# Patient Record
Sex: Male | Born: 1965 | Race: Asian | Hispanic: No | Marital: Married | Smoking: Former smoker
Health system: Southern US, Community
[De-identification: ages and names within clinical notes are randomized; demographics above are authoritative.]

## PROBLEM LIST (undated history)

## (undated) DIAGNOSIS — J189 Pneumonia, unspecified organism: Secondary | ICD-10-CM

---

## 2009-02-13 ENCOUNTER — Emergency Department (HOSPITAL_COMMUNITY): Admission: EM | Admit: 2009-02-13 | Discharge: 2009-02-13 | Payer: Self-pay | Admitting: Family Medicine

## 2010-11-23 ENCOUNTER — Emergency Department (HOSPITAL_COMMUNITY): Payer: Self-pay

## 2010-11-23 ENCOUNTER — Emergency Department (HOSPITAL_COMMUNITY)
Admission: EM | Admit: 2010-11-23 | Discharge: 2010-11-23 | Disposition: A | Discharge status: Home / Self Care | Payer: Self-pay | Attending: Emergency Medicine | Admitting: Emergency Medicine

## 2010-11-23 DIAGNOSIS — R0789 Other chest pain: Secondary | ICD-10-CM | POA: Insufficient documentation

## 2010-11-23 DIAGNOSIS — S2239XA Fracture of one rib, unspecified side, initial encounter for closed fracture: Secondary | ICD-10-CM | POA: Insufficient documentation

## 2011-06-05 ENCOUNTER — Observation Stay (HOSPITAL_COMMUNITY)
Admission: EM | Admit: 2011-06-05 | Discharge: 2011-06-06 | Disposition: A | Discharge status: Home / Self Care | Payer: Self-pay | Attending: Emergency Medicine | Admitting: Emergency Medicine

## 2011-06-05 ENCOUNTER — Encounter: Payer: Self-pay | Admitting: Emergency Medicine

## 2011-06-05 DIAGNOSIS — J189 Pneumonia, unspecified organism: Principal | ICD-10-CM | POA: Insufficient documentation

## 2011-06-05 DIAGNOSIS — E860 Dehydration: Secondary | ICD-10-CM

## 2011-06-05 DIAGNOSIS — R112 Nausea with vomiting, unspecified: Secondary | ICD-10-CM | POA: Insufficient documentation

## 2011-06-05 MED ORDER — ONDANSETRON HCL 4 MG/2ML IJ SOLN
INTRAMUSCULAR | Status: AC
  Administered 2011-06-05: 4 mg
  Filled 2011-06-05: qty 2

## 2011-06-05 NOTE — ED Notes (Signed)
C/o nausea, vomiting, and diarrhea x 30 min.
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
## 2011-06-05 NOTE — ED Notes (Signed)
PT reports his N/V started tonight. Pt has vomited multiple times

## 2011-06-06 ENCOUNTER — Emergency Department (HOSPITAL_COMMUNITY): Payer: Self-pay

## 2011-06-06 LAB — DIFFERENTIAL
Basophils Absolute: 0 10*3/uL (ref 0.0–0.1)
Basophils Relative: 0 % (ref 0–1)
Eosinophils Absolute: 0.3 10*3/uL (ref 0.0–0.7)
Eosinophils Relative: 1 % (ref 0–5)
Lymphocytes Relative: 2 % — ABNORMAL LOW (ref 12–46)
Lymphs Abs: 0.4 10*3/uL — ABNORMAL LOW (ref 0.7–4.0)
Monocytes Absolute: 1.5 10*3/uL — ABNORMAL HIGH (ref 0.1–1.0)
Monocytes Relative: 8 % (ref 3–12)
Neutro Abs: 17.3 10*3/uL — ABNORMAL HIGH (ref 1.7–7.7)
Neutrophils Relative %: 89 % — ABNORMAL HIGH (ref 43–77)

## 2011-06-06 LAB — COMPREHENSIVE METABOLIC PANEL
ALT: 27 U/L (ref 0–53)
AST: 28 U/L (ref 0–37)
Albumin: 4.2 g/dL (ref 3.5–5.2)
Alkaline Phosphatase: 62 U/L (ref 39–117)
BUN: 23 mg/dL (ref 6–23)
Chloride: 102 mEq/L (ref 96–112)
Potassium: 3.4 mEq/L — ABNORMAL LOW (ref 3.5–5.1)
Total Bilirubin: 1.4 mg/dL — ABNORMAL HIGH (ref 0.3–1.2)

## 2011-06-06 LAB — CBC
HCT: 48.3 % (ref 39.0–52.0)
Hemoglobin: 16.5 g/dL (ref 13.0–17.0)
MCH: 27.1 pg (ref 26.0–34.0)
MCHC: 34.2 g/dL (ref 30.0–36.0)
MCV: 79.4 fL (ref 78.0–100.0)
Platelets: 194 10*3/uL (ref 150–400)
RBC: 6.08 MIL/uL — ABNORMAL HIGH (ref 4.22–5.81)
RDW: 13.9 % (ref 11.5–15.5)
WBC: 19.5 10*3/uL — ABNORMAL HIGH (ref 4.0–10.5)

## 2011-06-06 LAB — URINALYSIS, ROUTINE W REFLEX MICROSCOPIC
Bilirubin Urine: NEGATIVE
Glucose, UA: NEGATIVE mg/dL
Hgb urine dipstick: NEGATIVE
Ketones, ur: 15 mg/dL — AB
Protein, ur: NEGATIVE mg/dL

## 2011-06-06 LAB — LIPASE, BLOOD: Lipase: 26 U/L (ref 11–59)

## 2011-06-06 MED ORDER — CEFTRIAXONE SODIUM 1 G IJ SOLR: 1.0000 g | Freq: Once | INTRAMUSCULAR | Status: DC

## 2011-06-06 MED ORDER — DEXTROSE 5 % IV SOLN
1.0000 g | Freq: Once | INTRAVENOUS | Status: AC
  Administered 2011-06-06: 1 g via INTRAVENOUS

## 2011-06-06 MED ORDER — PROMETHAZINE HCL 25 MG PO TABS: 25.0000 mg | ORAL_TABLET | Freq: Four times a day (QID) | ORAL | Status: AC | PRN

## 2011-06-06 MED ORDER — MORPHINE SULFATE 4 MG/ML IJ SOLN
4.0000 mg | Freq: Once | INTRAMUSCULAR | Status: AC
  Administered 2011-06-06: 4 mg via INTRAVENOUS
  Filled 2011-06-06: qty 1

## 2011-06-06 MED ORDER — SODIUM CHLORIDE 0.9 % IV SOLN
INTRAVENOUS | Status: DC
  Administered 2011-06-06 (×2): via INTRAVENOUS

## 2011-06-06 MED ORDER — SODIUM CHLORIDE 0.9 % IV BOLUS (SEPSIS)
500.0000 mL | Freq: Once | INTRAVENOUS | Status: AC
  Administered 2011-06-06: 02:00:00 via INTRAVENOUS

## 2011-06-06 MED ORDER — DEXTROSE 5 % IV SOLN
INTRAVENOUS | Status: AC
  Filled 2011-06-06: qty 10

## 2011-06-06 MED ORDER — IOHEXOL 300 MG/ML  SOLN
100.0000 mL | Freq: Once | INTRAMUSCULAR | Status: AC | PRN
  Administered 2011-06-06: 100 mL via INTRAVENOUS

## 2011-06-06 MED ORDER — DOXYCYCLINE HYCLATE 100 MG PO CAPS: 100.0000 mg | ORAL_CAPSULE | Freq: Two times a day (BID) | ORAL | Status: AC

## 2011-06-06 NOTE — ED Notes (Signed)
Patient transported to CT 

## 2011-06-10 NOTE — ED Provider Notes (Signed)
History     CSN: [id number] Arrival date & time: 06/05/2011 10:27 PM   First MD Initiated Contact with Patient 06/06/11 0037      Chief Complaint  Patient presents with  . Emesis     Patient is a 45 y.o. male presenting with vomiting. The history is provided by the patient and a relative.  Emesis  This is a new problem. The current episode started 3 to 5 hours ago. The problem occurs 5 to 10 times per day. The problem has been rapidly worsening. The emesis has an appearance of stomach contents. There has been no fever. Associated symptoms include abdominal pain. Pertinent negatives include no chills, no fever and no headaches.  Pt's son is interpreting for him. Pt reports acute onset of N/V at approx 2000 this evening. States > 10 episodes of vomiting since onset. Episodic abd pain just prior to vomiting episodes that is relieved w/ vomiting. Pt relates that he felt well prior to onset fo symptoms.    History reviewed. No pertinent past medical history.  History reviewed. No pertinent past surgical history.  No family history on file.  History  Substance Use Topics  . Smoking status: Never Smoker   . Smokeless tobacco: Not on file  . Alcohol Use: No      Review of Systems  Constitutional: Negative.  Negative for fever and chills.  HENT: Negative.   Respiratory: Negative.   Cardiovascular: Negative.   Gastrointestinal: Positive for vomiting and abdominal pain.  Genitourinary: Negative.   Musculoskeletal: Negative.   Skin: Negative.   Neurological: Negative.  Negative for headaches.  Hematological: Negative.   Psychiatric/Behavioral: Negative.     Allergies  Review of patient's allergies indicates no known allergies.  Home Medications   Current Outpatient Rx  Name Route Sig Dispense Refill  . DOXYCYCLINE HYCLATE 100 MG PO CAPS Oral Take 1 capsule (100 mg total) by mouth 2 (two) times daily. 20 capsule 0  . PROMETHAZINE HCL 25 MG PO TABS Oral Take 1 tablet (25 mg  total) by mouth every 6 (six) hours as needed for nausea. 10 tablet 0    BP 114/76  Pulse 97  Temp(Src) 97.4 F (36.3 C) (Oral)  Resp 18  SpO2 98%  Physical Exam  Constitutional: He is oriented to person, place, and time. He appears well-developed and well-nourished.  HENT:  Head: Normocephalic and atraumatic.  Eyes: Conjunctivae are normal.  Neck: Neck supple.  Cardiovascular: Normal rate and regular rhythm.   Pulmonary/Chest: Effort normal and breath sounds normal.  Abdominal: Soft. Bowel sounds are normal.  Musculoskeletal: Normal range of motion.  Neurological: He is alert and oriented to person, place, and time.  Skin: Skin is warm and dry.  Psychiatric: He has a normal mood and affect.    ED Course  Procedures Pt w/ persistent n/v since approx 2000. Denies fever or other associated sx's. Will move to CDU for IV hydration and to await testing.  Pt w/o further episodes of n/v. Pt admits feeling much better after IVF's and medications.  Labs reveal significant dehydration. Leukocytosis 19.5. Remaining labs unremarkable.  Discussed pt w/ Dr Dierdre Highman. CXR concerning for retrocardiac PNA..  Will continue IVF's. Give IV Rocephin and plan for d/c home on Doxycycline. Findings, impression and plan discussed  w/ pt (and son) who are agreeable.    Labs Reviewed  CBC - Abnormal; Notable for the following:    WBC 19.5 (*)    RBC 6.08 (*)    All  other components within normal limits  DIFFERENTIAL - Abnormal; Notable for the following:    Neutrophils Relative 89 (*)    Neutro Abs 17.3 (*)    Lymphocytes Relative 2 (*)    Lymphs Abs 0.4 (*)    Monocytes Absolute 1.5 (*)    All other components within normal limits  COMPREHENSIVE METABOLIC PANEL - Abnormal; Notable for the following:    Potassium 3.4 (*)    Glucose, Bld 108 (*)    Total Bilirubin 1.4 (*)    All other components within normal limits  URINALYSIS, ROUTINE W REFLEX MICROSCOPIC - Abnormal; Notable for the following:     Specific Gravity, Urine 1.044 (*)    Ketones, ur 15 (*)    All other components within normal limits  LIPASE, BLOOD  LAB REPORT - SCANNED   No results found.   1. Nausea and vomiting   2. Pneumonia       MDM  HPI, PE and clinical findings c/w dehydration, viral process and likely PNA.        Leanne Chang, NP 06/10/11 1325  Medical screening examination/treatment/procedure(s) were performed by non-physician practitioner and as supervising physician I was immediately available for consultation/collaboration.   Sunnie Nielsen, MD 06/11/11 [phone]

## 2011-06-14 ENCOUNTER — Emergency Department (INDEPENDENT_AMBULATORY_CARE_PROVIDER_SITE_OTHER): Payer: Self-pay

## 2011-06-14 ENCOUNTER — Emergency Department (INDEPENDENT_AMBULATORY_CARE_PROVIDER_SITE_OTHER)
Admission: EM | Admit: 2011-06-14 | Discharge: 2011-06-14 | Disposition: A | Discharge status: Home / Self Care | Payer: Self-pay | Source: Home / Self Care | Attending: Emergency Medicine | Admitting: Emergency Medicine

## 2011-06-14 ENCOUNTER — Encounter (HOSPITAL_COMMUNITY): Payer: Self-pay | Admitting: *Deleted

## 2011-06-14 DIAGNOSIS — J189 Pneumonia, unspecified organism: Secondary | ICD-10-CM

## 2011-06-14 HISTORY — DX: Pneumonia, unspecified organism: J18.9

## 2011-06-14 MED ORDER — ALBUTEROL SULFATE HFA 108 (90 BASE) MCG/ACT IN AERS: 1.0000 | INHALATION_SPRAY | Freq: Four times a day (QID) | RESPIRATORY_TRACT | Status: AC | PRN

## 2011-06-14 MED ORDER — FLUTICASONE PROPIONATE 50 MCG/ACT NA SUSP: 2.0000 | Freq: Every day | NASAL | Status: DC

## 2011-06-14 MED ORDER — IBUPROFEN 600 MG PO TABS: 600.0000 mg | ORAL_TABLET | Freq: Four times a day (QID) | ORAL | Status: AC | PRN

## 2011-06-14 MED ORDER — HYDROCODONE-HOMATROPINE 5-1.5 MG/5ML PO SYRP: 5.0000 mL | ORAL_SOLUTION | Freq: Four times a day (QID) | ORAL | Status: AC | PRN

## 2011-06-14 NOTE — ED Notes (Signed)
Pt  Seen  Recently  In  Er  For   pnuemonia   And     Nausea  /  Vomiting        Placed  On  Anti  Biotics   And    Phenergan  -    He  Is  Here  Today  For  A recheck   -  He  Reports  Symptoms  Are  Better but has  Nasal  Congestion and  Back pain

## 2011-06-14 NOTE — ED Provider Notes (Signed)
History     CSN: [id number] Arrival date & time: 06/14/2011  2:29 PM   First MD Initiated Contact with Patient 06/14/11 1438      Chief Complaint  Patient presents with  . Back Pain   HPI Comments: Pt seen in Ed 9 days ago found to have PNA. Tx'd with rocephin, doxycycline and told to f/u here. Per familiy member, pt feeling better. Still with residual nonproductive cough, mid chest achiness when coughing, fatigue. No documented fevers. Wheezing at night. Eating and drinking well. No further episodes of emesis. States finished "one of the medications"  But does not know which one. Thinks it may be the doxycycline. Pt also c/o nasal congestion for "years" unchanged today. No sinus pressure, pain, purulent nasal d/c.   Patient is a 45 y.o. male presenting with cough.  Cough This is a new problem. The current episode started more than 1 week ago. The problem has been gradually improving. The cough is non-productive. Associated symptoms include chest pain and wheezing. Pertinent negatives include no sweats, no weight loss, no ear pain, no headaches, no rhinorrhea, no sore throat, no myalgias and no shortness of breath. Smoker: former smoker. His past medical history is significant for pneumonia.    Past Medical History  Diagnosis Date  . Pneumonia     History reviewed. No pertinent past surgical history.  History reviewed. No pertinent family history.  History  Substance Use Topics  . Smoking status: Former Games developer  . Smokeless tobacco: Not on file  . Alcohol Use: No      Review of Systems  Constitutional: Positive for fatigue. Negative for fever, weight loss and unexpected weight change.  HENT: Positive for congestion. Negative for ear pain, sore throat, rhinorrhea, postnasal drip and sinus pressure.   Respiratory: Positive for cough and wheezing. Negative for chest tightness and shortness of breath.   Cardiovascular: Positive for chest pain.  Gastrointestinal: Negative for  nausea and vomiting.  Musculoskeletal: Positive for arthralgias. Negative for myalgias.  Skin: Negative for rash.  Neurological: Negative for weakness and headaches.    Allergies  Review of patient's allergies indicates no known allergies.  Home Medications   Current Outpatient Rx  Name Route Sig Dispense Refill  . ALBUTEROL SULFATE HFA 108 (90 BASE) MCG/ACT IN AERS Inhalation Inhale 1-2 puffs into the lungs every 6 (six) hours as needed for wheezing. 1 Inhaler 0  . DOXYCYCLINE HYCLATE 100 MG PO CAPS Oral Take 1 capsule (100 mg total) by mouth 2 (two) times daily. 20 capsule 0  . FLUTICASONE PROPIONATE 50 MCG/ACT NA SUSP Nasal Place 2 sprays into the nose daily. 16 g 0  . HYDROCODONE-HOMATROPINE 5-1.5 MG/5ML PO SYRP Oral Take 5 mLs by mouth every 6 (six) hours as needed for cough or pain. 120 mL 0  . IBUPROFEN 600 MG PO TABS Oral Take 1 tablet (600 mg total) by mouth every 6 (six) hours as needed for pain. 30 tablet 0  . PROMETHAZINE HCL 25 MG PO TABS Oral Take 1 tablet (25 mg total) by mouth every 6 (six) hours as needed for nausea. 10 tablet 0    BP 117/77  Pulse 68  Temp(Src) 98 F (36.7 C) (Oral)  Resp 18  SpO2 99%  Physical Exam  Nursing note and vitals reviewed. Constitutional: He is oriented to person, place, and time. He appears well-developed and well-nourished.  HENT:  Head: Normocephalic and atraumatic.  Right Ear: Tympanic membrane normal.  Left Ear: Tympanic membrane normal.  Nose:  Mucosal edema and rhinorrhea present. Right sinus exhibits no maxillary sinus tenderness and no frontal sinus tenderness. Left sinus exhibits no maxillary sinus tenderness and no frontal sinus tenderness.  Mouth/Throat: Oropharynx is clear and moist and mucous membranes are normal.  Eyes: Conjunctivae and EOM are normal. Pupils are equal, round, and reactive to light.  Neck: Normal range of motion. Neck supple.  Cardiovascular: Regular rhythm.   Pulmonary/Chest: Effort normal and breath  sounds normal. No respiratory distress. He has no wheezes. He has no rales. He exhibits tenderness.       Chest tenderness at costochondral junctions  Abdominal: Soft. Bowel sounds are normal. He exhibits no distension.  Musculoskeletal: Normal range of motion.  Lymphadenopathy:    He has no cervical adenopathy.  Neurological: He is alert and oriented to person, place, and time.  Skin: Skin is warm and dry.  Psychiatric: He has a normal mood and affect. His behavior is normal.    ED Course  Procedures (including critical care time)  Labs Reviewed - No data to display Dg Chest 2 View  06/14/2011  *RADIOLOGY REPORT*  Clinical Data: Pneumonia  CHEST - 2 VIEW  Comparison: June 06, 2011  Findings: The cardiac silhouette, mediastinum, pulmonary vasculature are within normal limits.  Both lungs are clear. There is no acute bony abnormality.  IMPRESSION: There is no evidence of acute cardiac or pulmonary process.  Original Report Authenticated By: Brandon Melnick, M.D.     1. Pneumonia       MDM  Prev I  Imaging reviewed- with retrocardiac PNA on prev XR. Pt clinically improving, no h/o fevers. Still with residual fatigue, chest soreness, cough. Suspect back pain from coughing.  No wheezing, good air movement here. However will recheck CXR.   No evidence of PNA on today's CXR. Also with chronic nasal congestion for years. No evidence of acute sinusitis. will try nasal steriods but if no improvement then pt may need to see ENT. Referred to [facility]NT who is on call today.   Luiz Blare, MD 06/14/11 [phone]

## 2011-10-16 ENCOUNTER — Emergency Department (HOSPITAL_COMMUNITY): Payer: No Typology Code available for payment source

## 2011-10-16 ENCOUNTER — Emergency Department (HOSPITAL_COMMUNITY)
Admission: EM | Admit: 2011-10-16 | Discharge: 2011-10-16 | Disposition: A | Discharge status: Home / Self Care | Payer: No Typology Code available for payment source | Attending: Emergency Medicine | Admitting: Emergency Medicine

## 2011-10-16 ENCOUNTER — Encounter (HOSPITAL_COMMUNITY): Payer: Self-pay

## 2011-10-16 DIAGNOSIS — M546 Pain in thoracic spine: Secondary | ICD-10-CM | POA: Insufficient documentation

## 2011-10-16 DIAGNOSIS — R079 Chest pain, unspecified: Secondary | ICD-10-CM | POA: Insufficient documentation

## 2011-10-16 DIAGNOSIS — Y92410 Unspecified street and highway as the place of occurrence of the external cause: Secondary | ICD-10-CM | POA: Insufficient documentation

## 2011-10-16 DIAGNOSIS — M542 Cervicalgia: Secondary | ICD-10-CM | POA: Insufficient documentation

## 2011-10-16 DIAGNOSIS — M549 Dorsalgia, unspecified: Secondary | ICD-10-CM

## 2011-10-16 LAB — POCT I-STAT, CHEM 8
BUN: 18 mg/dL (ref 6–23)
Calcium, Ion: 1.14 mmol/L (ref 1.12–1.32)
Chloride: 105 mEq/L (ref 96–112)
Glucose, Bld: 92 mg/dL (ref 70–99)

## 2011-10-16 MED ORDER — HYDROCODONE-ACETAMINOPHEN 5-325 MG PO TABS: 2.0000 | ORAL_TABLET | ORAL | Status: AC | PRN

## 2011-10-16 MED ORDER — IBUPROFEN 600 MG PO TABS: 600.0000 mg | ORAL_TABLET | Freq: Four times a day (QID) | ORAL | Status: AC | PRN

## 2011-10-16 MED ORDER — CYCLOBENZAPRINE HCL 10 MG PO TABS: 10.0000 mg | ORAL_TABLET | Freq: Two times a day (BID) | ORAL | Status: AC | PRN
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
## 2011-10-16 NOTE — ED Notes (Signed)
The patient advised me I should discuss our findings in front of his family.
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
## 2011-10-16 NOTE — ED Notes (Signed)
Patient is AOx4 and his family states he is comfortable with discharge instructions.

## 2011-11-08 ENCOUNTER — Encounter (HOSPITAL_COMMUNITY): Payer: Self-pay | Admitting: Emergency Medicine

## 2011-11-08 ENCOUNTER — Emergency Department (INDEPENDENT_AMBULATORY_CARE_PROVIDER_SITE_OTHER)
Admission: EM | Admit: 2011-11-08 | Discharge: 2011-11-08 | Disposition: A | Discharge status: Home / Self Care | Payer: Self-pay | Source: Home / Self Care | Attending: Family Medicine | Admitting: Family Medicine

## 2011-11-08 DIAGNOSIS — M62838 Other muscle spasm: Secondary | ICD-10-CM

## 2011-11-08 DIAGNOSIS — M549 Dorsalgia, unspecified: Secondary | ICD-10-CM

## 2011-11-08 MED ORDER — DIAZEPAM 5 MG PO TABS: 5.0000 mg | ORAL_TABLET | Freq: Three times a day (TID) | ORAL | Status: AC | PRN

## 2011-11-08 MED ORDER — IBUPROFEN 800 MG PO TABS: 800.0000 mg | ORAL_TABLET | Freq: Three times a day (TID) | ORAL | Status: AC | PRN
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
## 2011-11-08 NOTE — Discharge Instructions (Signed)
Read the information below.  Please use the prescribed medication as needed for back pain.  When these medications run out, please try over the counter medications such as Tylenol (acetaminophen) or Ibuprofen (advil, motrin) for pain.  Your pharmacist can help you find pain medication at your drug store ([facility], etc.).  If you develop fevers, bowel or bladder incontinence or retention, weakness or numbness of your legs, go to your nearest emergency room.  You may return to the urgent care at any time for worsening condition or any new symptoms that concern you.   If you have no primary doctor, here are some resources that may be helpful:  Medicaid-accepting [facility] Providers:   - [facility]- [address]      [phone]      Mon-Fri 9am-7pm, Sat 9am-1pm   - [facility]- [address] [place], [place]      [phone]   - [facility]- [address] [place]      [phone]   [facility]- [address]      [phone]   - Renaye Rakers- [address] [place], [address]      [phone]      Only accepts [place] patients       after they have her name applied to their card   Self Pay (no insurance) in Startup:   - Sickle Cell Patients: Dr Willey Blade, [facility]      [address] [place]      [phone]   - [facility] [phone]   - Physician Referral Service- [phone]   - [facility]- [address] [place]      [phone]   [facility] [place]- [address]   - [facility]- see information above      (Speak to [employer] if you do not have insurance)   - [facility]- [address] [place]      [phone]   - [facility] [place]- [address]      [phone]   - [facility]- [address]      [phone]   - Dr Julio Sicks-  [address], [place]      [phone]   -  [facility]- [address]      [phone]   - [facility]- [address]      [phone]      Also [address]      [phone]   - [facility]- [address]      [phone]      1st and 3rd Saturday every month, 10am-1pm Other agencies that provide inexpensive medical care:    [facility]  [phone]    [facility]  [phone]    [facility]  [phone]    [facility]  [phone]    [facility]  [phone]  General Information: Finding a doctor when you do not have health insurance can be tricky. Although you are not limited by an insurance plan, you are of course limited by her finances and how much but he can pay out of pocket.  What are your options if you don't have health insurance?   1) Find a Librarian, academic and  Pay Out of Pocket Although you won't have to find out who is covered by your insurance plan, it is a good idea to ask around and get recommendations. You will then need to call the office and see if the doctor you have chosen will accept you as a new patient and what types of options they offer for patients who are self-pay. Some doctors offer discounts or will set up payment plans for their patients who do not have insurance, but you will need to ask so you aren't surprised when you get to your appointment.  2) Contact Your Local Health Department Not all health departments have doctors that can see patients for sick visits, but many do, so it is worth a call to see if yours does. If you don't know where your local health department is, you can check in your phone book. The CDC also has a tool to help you locate your state's health department, and many state websites also have listings of all of their local health departments.  3) Find a Walk-in Clinic If your illness is not likely to be very severe or complicated, you may want to try a walk in clinic. These are popping up all over the  country in pharmacies, drugstores, and shopping centers. They're usually staffed by nurse practitioners or physician assistants that have been trained to treat common illnesses and complaints. They're usually fairly quick and inexpensive. However, if you have serious medical issues or chronic medical problems, these are probably not your best option
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
## 2011-11-09 NOTE — ED Provider Notes (Signed)
Medical screening examination/treatment/procedure(s) were performed by resident physician or non-physician practitioner and as supervising physician I was immediately available for consultation/collaboration.   Nadeem Romanoski DOUGLAS MD.    Devina Bezold D Wentworth Edelen, MD 11/09/11 2059 

## 2013-02-23 IMAGING — CT CT ABD-PELV W/ CM
2 of 5 series · 17 of 46 positions shown, 19 images · IV contrast (APPLIED)
Comparison: None.

CLINICAL DATA: Acute onset of abdominal pain, nausea and vomiting.

CT ABDOMEN AND PELVIS WITH CONTRAST
TECHNIQUE: Multidetector CT imaging of the abdomen and pelvis was
performed following the standard protocol during bolus
administration of intravenous contrast.
Contrast: 100mL OMNIPAQUE IOHEXOL 300 MG/ML IV SOLN

[Series 2: abd/pelv with 5.0 b31f st · axial · 0.63mm/px · z∈[-354,+26]mm · 14 of 86 slices shown, 16 images]
[im 5/86  soft-tissue]
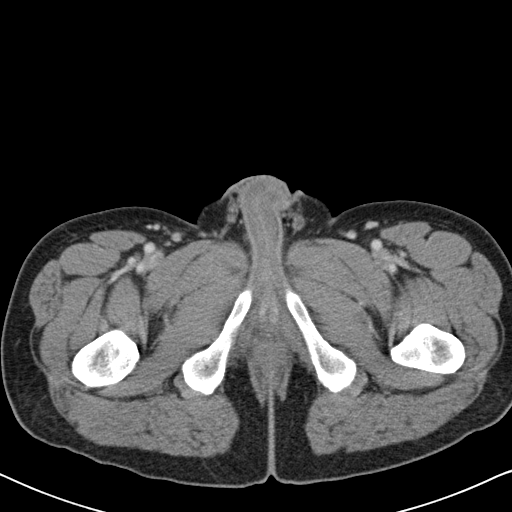
[im 5/86  bone]
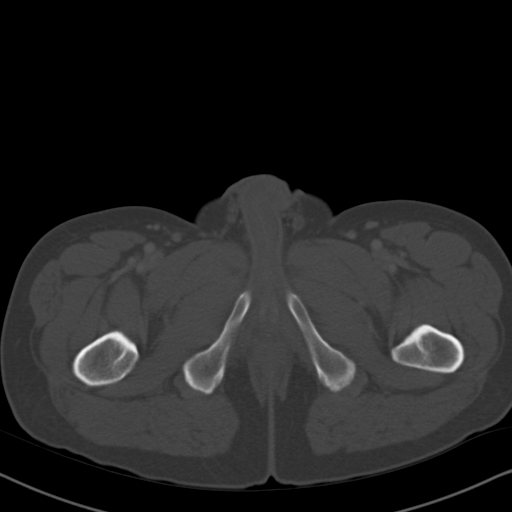
[im 13/86  soft-tissue]
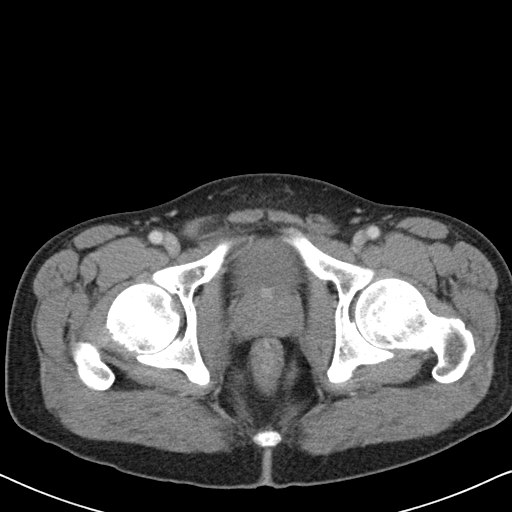
[im 18/86  soft-tissue]
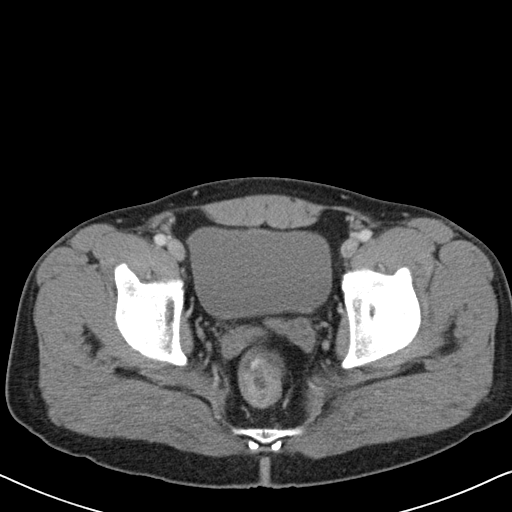
[im 22/86  soft-tissue]
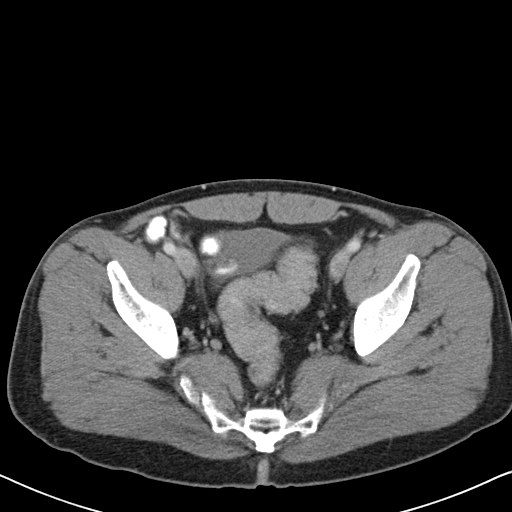
[im 30/86  soft-tissue]
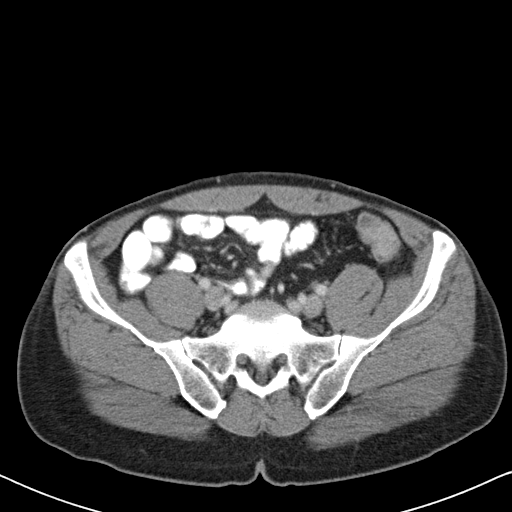
[im 35/86  soft-tissue]
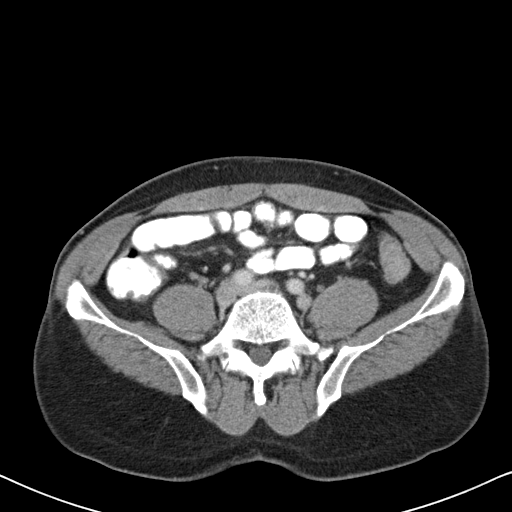
[im 39/86  soft-tissue]
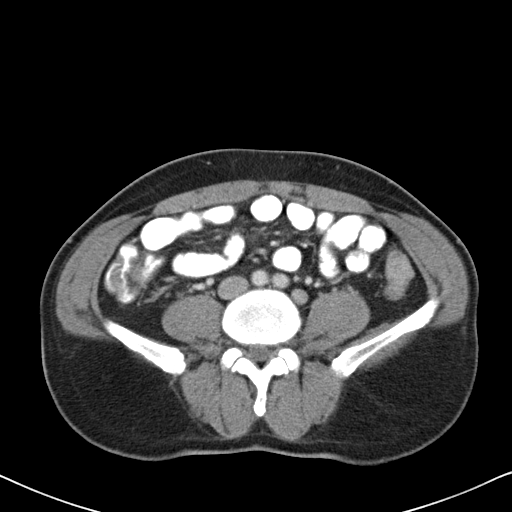
[im 47/86  soft-tissue]
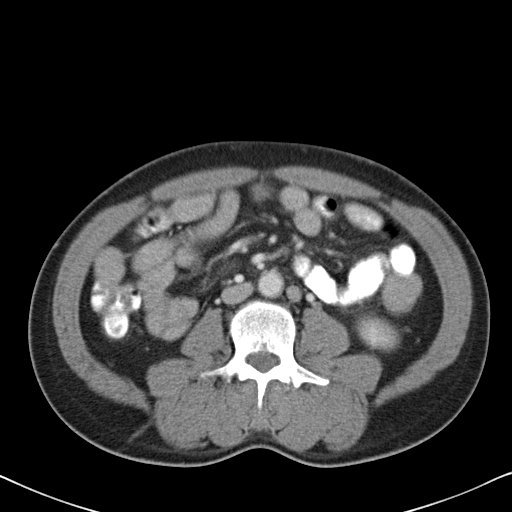
[im 52/86  soft-tissue]
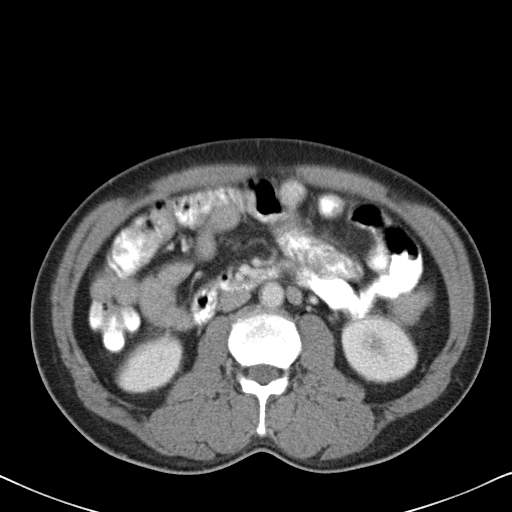
[im 52/86  bone]
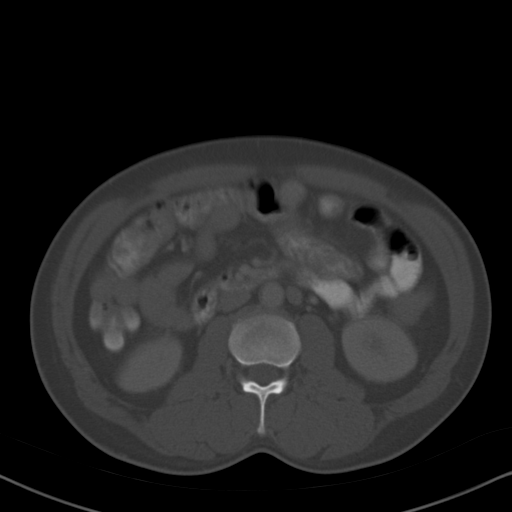
[im 56/86  soft-tissue]
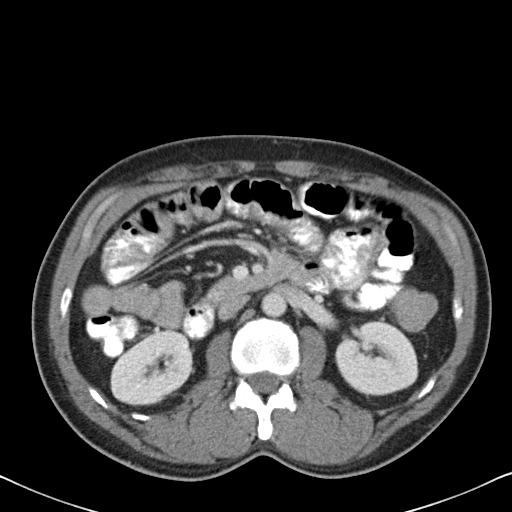
[im 64/86  soft-tissue]
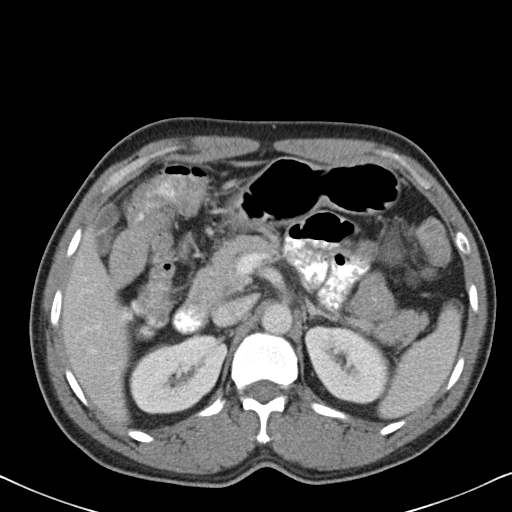
[im 69/86  soft-tissue]
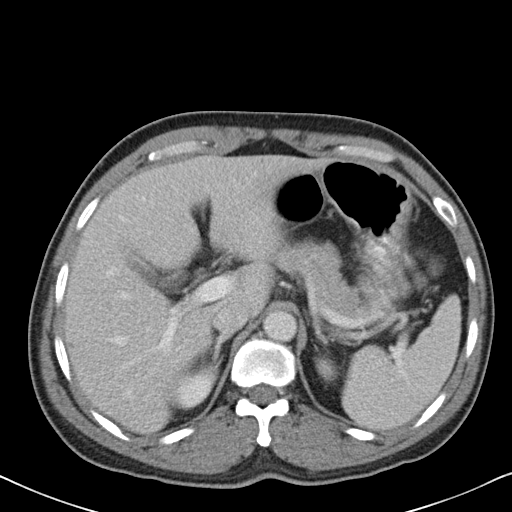
[im 73/86  soft-tissue]
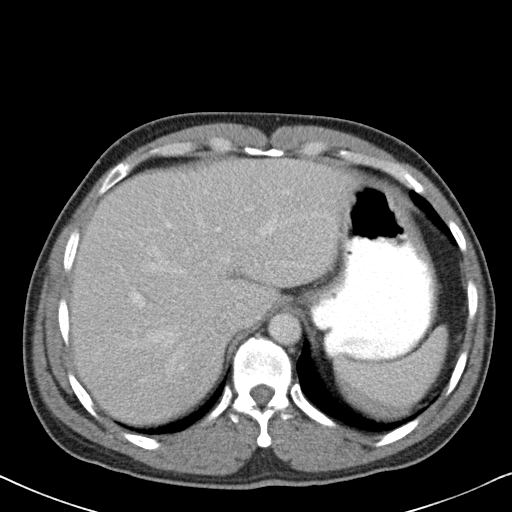
[im 81/86  soft-tissue]
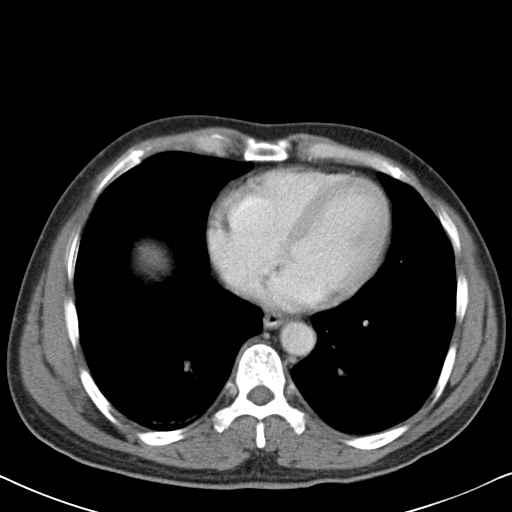

[Series 602: cor · coronal · 0.84mm/px · 3 of 106 slices shown]
[im 36/106  soft-tissue]
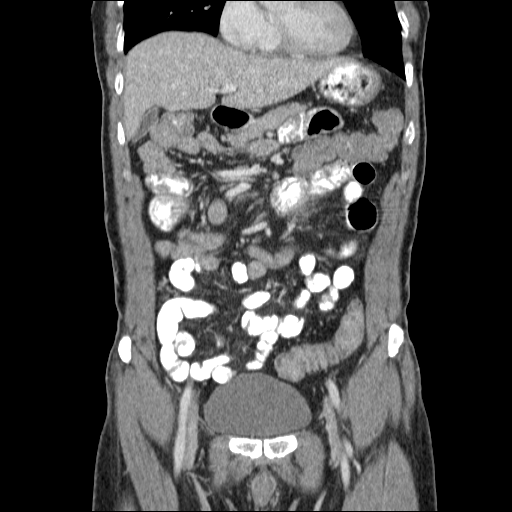
[im 47/106  soft-tissue]
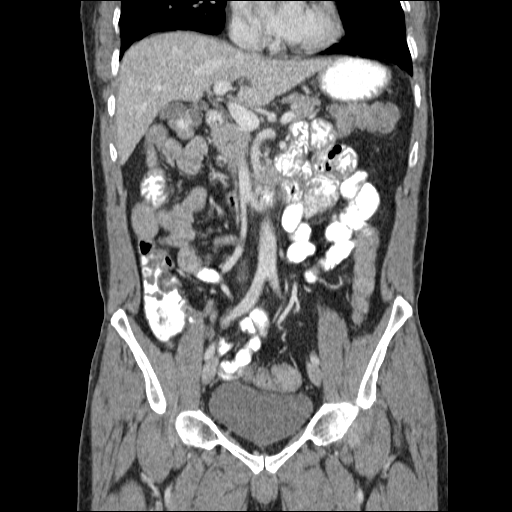
[im 59/106  soft-tissue]
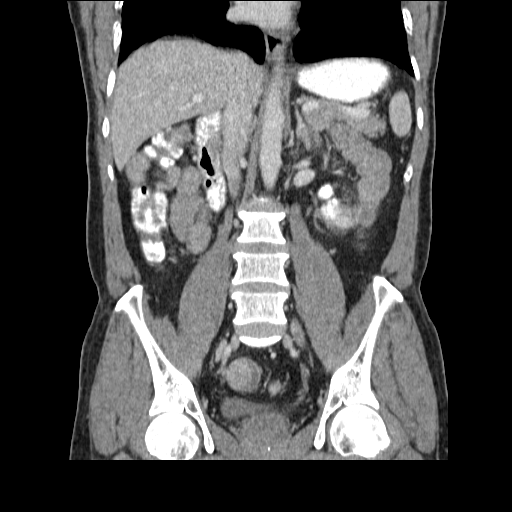

[17 of 46 positions shown; findings below may reference images not displayed]

FINDINGS: Minimal bibasilar atelectasis is noted.

A 4 mm hypodensity within the right hepatic lobe is nonspecific but
may reflect a small cyst.  The liver is otherwise unremarkable.
The spleen is unremarkable in appearance.  The gallbladder is
partially contracted, with a Phrygian cap seen.  It is unremarkable
in appearance.  The pancreas and adrenal glands are within normal
limits.

The kidneys are unremarkable in appearance.  There is no evidence
of hydronephrosis.  No renal or ureteral stones are seen.  No
perinephric stranding is appreciated.

No free fluid is identified.  The small bowel is unremarkable in
appearance.  The stomach is within normal limits.  No acute
vascular abnormalities are seen.  Incidental note is made of a
duplicated IVC.  Minimal calcification is noted along the distal
abdominal aorta.

The appendix is decompressed and unremarkable in appearance,
without evidence for appendicitis.  Contrast progresses to the
level of the hepatic flexure of the colon.  The remainder of the
colon appears filled with liquid stool; the colon is unremarkable
in appearance.

The bladder is mildly distended and grossly unremarkable in
appearance.  The prostate remains normal in size, with minimal
calcification.  No inguinal lymphadenopathy is seen.

No acute osseous abnormalities are identified.
IMPRESSION: 1.  No acute abnormalities identified within the abdomen or pelvis.
2.  Tiny hepatic cyst suggested.
3.  Incidental note made of a duplicated IVC.
4.  Minimal calcification along the distal abdominal aorta.

## 2014-04-30 ENCOUNTER — Emergency Department (HOSPITAL_COMMUNITY)
Admission: EM | Admit: 2014-04-30 | Discharge: 2014-04-30 | Disposition: A | Discharge status: Home / Self Care | Payer: BC Managed Care – PPO | Source: Home / Self Care | Attending: Emergency Medicine | Admitting: Emergency Medicine

## 2014-04-30 ENCOUNTER — Encounter (HOSPITAL_COMMUNITY): Payer: Self-pay | Admitting: Emergency Medicine

## 2014-04-30 DIAGNOSIS — J019 Acute sinusitis, unspecified: Secondary | ICD-10-CM

## 2014-04-30 DIAGNOSIS — J069 Acute upper respiratory infection, unspecified: Secondary | ICD-10-CM

## 2014-04-30 LAB — POCT RAPID STREP A: Streptococcus, Group A Screen (Direct): NEGATIVE

## 2014-04-30 MED ORDER — AMOXICILLIN 500 MG PO CAPS: 1000.0000 mg | ORAL_CAPSULE | Freq: Three times a day (TID) | ORAL | Status: AC

## 2014-04-30 MED ORDER — IPRATROPIUM BROMIDE 0.06 % NA SOLN: 2.0000 | Freq: Four times a day (QID) | NASAL | Status: AC

## 2014-04-30 MED ORDER — GUAIFENESIN-CODEINE 100-10 MG/5ML PO SYRP: 5.0000 mL | ORAL_SOLUTION | Freq: Three times a day (TID) | ORAL | Status: AC | PRN

## 2014-04-30 NOTE — Discharge Instructions (Signed)

## 2014-04-30 NOTE — ED Notes (Addendum)
Via daughter who speaks [place]...  Pt c/o BA x1 week Sx include: sneezing, productive cough, congestion, fever, diarrhea Alert, no signs of acute distress.

## 2014-04-30 NOTE — ED Provider Notes (Signed)
Chief Complaint   URI   History of Present Illness   Eric Barry is a 48 year old male who has had a one-week history of cough productive yellow sputum, chest tightness, chest pain, nasal congestion, sneezing, rhinorrhea, headache, and eye pain. He's felt achy all over his before meals in his back and his neck, had sore throat and diarrhea. No known sick exposures.  Review of Systems   Other than as noted above, the patient denies any of the following symptoms: Systemic:  No fevers, chills, sweats, or myalgias. Eye:  No redness or discharge. ENT:  No ear pain, headache, nasal congestion, drainage, sinus pressure, or sore throat. Neck:  No neck pain, stiffness, or swollen glands. Lungs:  No cough, sputum production, hemoptysis, wheezing, chest tightness, shortness of breath or chest pain. GI:  No abdominal pain, nausea, vomiting or diarrhea.  PMFSH   Past medical history, family history, social history, meds, and allergies were reviewed.   Physical exam   Vital signs:  BP 135/83  Pulse 66  Temp(Src) 98.1 F (36.7 C) (Oral)  Resp 18  SpO2 98% General:  Alert and oriented.  In no distress.  Skin warm and dry. Eye:  No conjunctival injection or drainage. Lids were normal. ENT:  TMs and canals were normal, without erythema or inflammation.  Nasal mucosa was congested with yellow drainage.  Mucous membranes were moist.  Pharynx was erythematous with no exudate or drainage.  There were no oral ulcerations or lesions. Neck:  Supple, no adenopathy, tenderness or mass. Lungs:  No respiratory distress.  Lungs were clear to auscultation, without wheezes, rales or rhonchi.  Breath sounds were clear and equal bilaterally.  Heart:  Regular rhythm, without gallops, murmers or rubs. Skin:  Clear, warm, and dry, without rash or lesions.  Labs   Results for orders placed during the hospital encounter of 04/30/14  POCT RAPID STREP A ([facility]ARE ONLY)      Result Value Ref Range   Streptococcus, Group A Screen (Direct) NEGATIVE  NEGATIVE    Assessment     The primary encounter diagnosis was Viral URI. A diagnosis of Acute sinusitis, recurrence not specified, unspecified location was also pertinent to this visit.  There is no evidence of pneumonia, strep throat, otitis media.    Plan    1.  Meds:  The following meds were prescribed:   Discharge Medication List as of 04/30/2014  1:49 PM    START taking these medications   Details  amoxicillin (AMOXIL) 500 MG capsule Take 2 capsules (1,000 mg total) by mouth 3 (three) times daily., Starting 04/30/2014, Until Discontinued, Normal    guaiFENesin-codeine (ROBITUSSIN AC) 100-10 MG/5ML syrup Take 5 mLs by mouth 3 (three) times daily as needed for cough., Starting 04/30/2014, Until Discontinued, Print    ipratropium (ATROVENT) 0.06 % nasal spray Place 2 sprays into both nostrils 4 (four) times daily., Starting 04/30/2014, Until Discontinued, Normal        2.  Patient Education/Counseling:  The patient was given appropriate handouts, self care instructions, and instructed in symptomatic relief.  Instructed to get extra fluids and extra rest.    3.  Follow up:  The patient was told to follow up here if no better in 3 to 4 days, or sooner if becoming worse in any way, and given some red flag symptoms such as increasing fever, difficulty breathing, chest pain, or persistent vomiting which would prompt immediate return.       Reuben Likesavid C Roey Coopman, MD  04/30/14 1554 

## 2014-05-02 LAB — CULTURE, GROUP A STREP
